# Patient Record
Sex: Female | Born: 1985 | Hispanic: Yes | Marital: Married
Health system: Midwestern US, Community
[De-identification: ages and names within clinical notes are randomized; demographics above are authoritative.]

## PROBLEM LIST (undated history)

## (undated) DIAGNOSIS — K529 Noninfective gastroenteritis and colitis, unspecified: Secondary | ICD-10-CM

## (undated) DIAGNOSIS — J069 Acute upper respiratory infection, unspecified: Secondary | ICD-10-CM

## (undated) DIAGNOSIS — N761 Subacute and chronic vaginitis: Secondary | ICD-10-CM

## (undated) DIAGNOSIS — B3731 Acute candidiasis of vulva and vagina: Secondary | ICD-10-CM

---

## 2016-01-21 ENCOUNTER — Ambulatory Visit
Admit: 2016-01-21 | Discharge: 2016-01-21 | Payer: PRIVATE HEALTH INSURANCE | Attending: Hospitalist | Primary: Family Medicine

## 2016-01-21 DIAGNOSIS — R0981 Nasal congestion: Secondary | ICD-10-CM

## 2016-01-21 MED ORDER — FLUTICASONE PROPIONATE 50 MCG/ACT NA SUSP
50 MCG/ACT | Freq: Every day | NASAL | 1 refills | Status: DC
Start: 2016-01-21 — End: 2016-06-01

## 2016-01-21 MED ORDER — ACYCLOVIR 5 % EX OINT
5 % | CUTANEOUS | 1 refills | Status: AC
Start: 2016-01-21 — End: 2016-01-28

## 2016-01-21 NOTE — Progress Notes (Signed)
Visit Information    Have you changed or started any medications since your last visit including any over-the-counter medicines, vitamins, or herbal medicines? yes - herbbal medications   Are you having any side effects from any of your medications? -  no  Have you stopped taking any of your medications? Is so, why? -  no    Have you seen any other physician or provider since your last visit? No  Have you had any other diagnostic tests since your last visit? No  Have you been seen in the emergency room and/or had an admission to a hospital since we last saw you? No  Have you had your routine dental cleaning in the past 6 months? no    Have you activated your MyChart account? If not, what are your barriers? Yes     No care team member to display    Medical History Review  Past Medical, Family, and Social History reviewed and does contribute to the patient presenting condition    Health Maintenance   Topic Date Due   ??? HIV screen  02/03/2003   ??? DTaP/Tdap/Td vaccine (1 - Tdap) 02/03/2007   ??? Cervical cancer screen  02/02/2009   ??? Flu vaccine (Season Ended) 05/05/2016

## 2016-01-21 NOTE — Progress Notes (Signed)
Attending Physician Statement  I have discussed the care of Allison Gibson, including pertinent history and exam findings with the resident. I have reviewed the key elements of all parts of the encounter with the resident. I have seen and examined the patient with the resident and the key elements of all parts of the encounter have been performed by me.  She has never seen PCP b/c of lack of insurance. She got coverage while pregnant. Now here to est care. I agree with the assessment, and status of the problem list as documented. The plan and orders should include   Orders Placed This Encounter   Procedures   ??? PAP Smear    and this was also documented by the resident.  Refuses vaccines- needs PAP at next visit .The medication list was reviewed with the resident and is up to date. The return visit should be in 1 month . Will obtain records from [place] and then rtc for follow up and PAP.    Allison Lameawan Breann Losano-Kasmani, MD, [place]ACP  [facility]  [facility]  Associate Program Director Department of Internal Medicine  Internal Medicine Clerkship Site Director- [facility]  01/21/2016, 11:56 AM

## 2016-01-21 NOTE — Patient Instructions (Addendum)
Thank you for enrolling in MyChart. Please follow the instructions below to securely access your online medical record. MyChart allows you to send messages to your doctor, view your test results, renew your prescriptions, schedule appointments, and more.     How Do I Sign Up?  1. In your Internet browser, go to [email or web address]  2. Click on the Sign Up Now link in the Sign In box. You will see the New Member Sign Up page.  3. Enter your MyChart Access Code exactly as it appears below. You will not need to use this code after you???ve completed the sign-up process. If you do not sign up before the expiration date, you must request a new code.  MyChart Access Code: [email or web address]  Expires: 03/21/2016 11:27 AM    4. Enter your Social Security Number ([id number]) and Date of Birth (mm/dd/yyyy) as indicated and click Submit. You will be taken to the next sign-up page.  5. Create a MyChart ID. This will be your MyChart login ID and cannot be changed, so think of one that is secure and easy to remember.  6. Create a MyChart password. You can change your password at any time.  7. Enter your Password Reset Question and Answer. This can be used at a later time if you forget your password.   8. Enter your e-mail address. You will receive e-mail notification when new information is available in MyChart.  9. Click Sign Up. You can now view your medical record.     Additional Information  If you have questions, please contact your physician practice where you receive care. Remember, MyChart is NOT to be used for urgent needs. For medical emergencies, dial 911.  Return appointment card and Summary of Care was reviewed and copy was given to the patient.  JM  [facility] and they will be contacting you with an appt, scheduling number is on referral form if you do not receive an appt in a timely manner JM

## 2016-01-21 NOTE — Progress Notes (Signed)
[facility]    New Patient Note/History and Physical    Date of patient's visit: 01/21/2016  Name:  Allison BrokerSarah Klang  Primary Care Physician: No primary care provider on file.    Reason for visit: First Visit, establish care     HISTORY OF PRESENTING ILLNESS:    History was obtained from the patient. Allison Gibson is a 30 y.o. is here to establish care.   She recently moved from [place] came to establish care. Denies any symptoms at this visit.   Past medical history of hemorrhoids. Does not want to take ant vaccinations.       PAST MEDICAL HISTORY:          Diagnosis Date   ??? Anxiety    ??? Chronic back pain        PAST SURGICAL HISTORY:          Procedure Laterality Date   ??? FRACTURE SURGERY     ??? LYMPH NODE DISSECTION         ALLERGIES:    No Known Allergies      HOME MEDICATION:      No current outpatient prescriptions on file prior to visit.     No current facility-administered medications on file prior to visit.        SOCIAL HISTORY:    TOBACCO:   reports that she has never smoked. She has never used smokeless tobacco.  ETOH:   reports that she drinks about 1.2 oz of alcohol per week   DRUGS:  reports that she does not use illicit drugs.  OCCUPATION:  None     FAMILY HISTORY:          Problem Relation Age of Onset   ??? Diabetes Mother    ??? Cancer Mother    ??? Hypotension Father    ??? High Cholesterol Father    ??? Migraines Father        REVIEW OF SYSTEMS:    ENT: negative  Respiratory: no cough, shortness of breath, or wheezing  Cardiovascular: no chest pain or dyspnea on exertion  Gastrointestinal: no abdominal pain, black or bloody stools  Neurological: no TIA or stroke symptoms  Endocrine: negative  Genito-Urinary: no dysuria, trouble voiding, or hematuria  Allergy and Immunology: negative  Hematological and Lymphatic: negative  Musculoskeletal: negative  Dermatological: negative    PHYSICAL EXAM:      Vitals:    01/21/16 1117   BP: 119/78   Pulse: 87      General appearance -  alert, well appearing, and in no distress  Mental status - alert, oriented to person, place, and time  Eyes - pupils equal and reactive  Ears - bilateral TM's and external ear canals normal  Nose - normal and patent  Mouth - mucous membranes moist, bifid tongue  Neck - supple, no significant adenopathy  Chest - clear to auscultation  Heart - normal S1, S2  Abdomen - soft, nontender, nondistended  Back exam - full range of motion, no tenderness  Neurological - alert, oriented, normal speech  Musculoskeletal - no joint tenderness  Extremities - peripheral pulses normal, no pedal edema  Skin - normal coloration and turgor, no rashes noted    LABORATORY FINDINGS:    CBC: No results found for: WBC, HGB, PLT  BMP:  No results found for: NA, K, CL, CO2, BUN, CREATININE, GLUCOSE  Hemoglobin A1C: No results found for: LABA1C  Lipid profile: No results found for: CHOL, TRIG,  HDL  Thyroid functions: No results found for: TSH   Hepatic functions: No results found for: ALT, AST, PROT, BILITOT, BILIDIR, LABALBU  Assessment and Plan:   1. Congestion of nasal sinus    - fluticasone (FLONASE) 50 MCG/ACT nasal spray; 1 spray by Nasal route daily  Dispense: 1 Bottle; Refill: 1    2. Oral herpes    - acyclovir (ZOVIRAX) 5 % ointment; Apply topically 5 times daily.  Dispense: 1 Tube; Refill: 1    3. Anxiety  Is taking herbal supplements Rhodelia and L tyrosine, turmeric  Discussed the potential harms of taking Over the counter drugs  States she has an upcoming appointments with psychiatrist    4. Screening    - PAP Smear; Future  - obtain records from outside hospital PAP smear and other work up    5. Hemorrhoids  - General surgery referral      Follow-up:       Bradley received counseling on the following healthy behaviors: nutrition, exercise    Discussed use, benefit, and side effects of prescribed medications.  Barriers to medication compliance addressed.  All patient questions answered.  Pt voiced understanding.        Blanchie Dessert, MD, PGY-1 Internal Medicine Resident  [facility], [place], [place]    01/21/2016  11:49 AM

## 2016-01-28 ENCOUNTER — Telehealth

## 2016-01-28 NOTE — Telephone Encounter (Signed)
Pt is sure she has a yeast infection. Pt complains of itching and it is getting worse. Pt would like some medication called into pharmacy. Please advise if pt needs to be seen. Pt would like a call back.

## 2016-01-28 NOTE — Telephone Encounter (Signed)
Patient needs to be seen please schedule the earliest appointment with any available PCP.  She needs some labs.

## 2016-01-28 NOTE — Telephone Encounter (Signed)
PC to pt, pt refuses appt for now. Pt states she gets them all the time. Pt will buy over the counter medication for now.

## 2016-02-05 ENCOUNTER — Encounter: Payer: PRIVATE HEALTH INSURANCE | Primary: Family Medicine

## 2016-02-12 MED ORDER — MICONAZOLE NITRATE 2 % VA CREA
2 % | VAGINAL | 1 refills | Status: AC
Start: 2016-02-12 — End: 2016-02-19

## 2016-02-12 NOTE — Telephone Encounter (Signed)
Miconazole ordered for vaginitis

## 2016-06-01 ENCOUNTER — Ambulatory Visit
Admit: 2016-06-01 | Discharge: 2016-06-01 | Payer: PRIVATE HEALTH INSURANCE | Attending: Family Medicine | Primary: Family Medicine

## 2016-06-01 DIAGNOSIS — F32A Depression, unspecified: Secondary | ICD-10-CM

## 2016-06-01 MED ORDER — FLUTICASONE PROPIONATE 50 MCG/ACT NA SUSP
50 | Freq: Every day | NASAL | 1 refills | Status: DC
Start: 2016-06-01 — End: 2017-11-01

## 2016-06-01 NOTE — Progress Notes (Signed)
Subjective:      Patient ID: Allison Gibson is a 30 y.o. female.  Visit Information    Have you changed or started any medications since your last visit including any over-the-counter medicines, vitamins, or herbal medicines? No  Are you having any side effects from any of your medications? -  no  Have you stopped taking any of your medications? Is so, why? -  no    Have you seen any other physician or provider since your last visit? No - new patient.  Have you had any other diagnostic tests since your last visit? No - new patient.   Have you been seen in the emergency room and/or had an admission to a hospital since we last saw you? No - new patient.   Have you had your routine dental cleaning in the past 6 months? no    Have you activated your MyChart account? If not, what are your barriers? No: Will sign up today.      Patient Care Team:  Archie Endo, MD as PCP - General (Family Medicine)    Medical History Review  Past Medical, Family, and Social History reviewed and does not contribute to the patient presenting condition    Health Maintenance   Topic Date Due   ??? HIV screen  02/03/2003   ??? DTaP/Tdap/Td vaccine (1 - Tdap) 02/03/2007   ??? Cervical cancer screen  02/02/2009   ??? Flu vaccine (1) 05/05/2016     HPI  This 30 year old female is seen first  by me in the office the patient has history of anxiety and depression was on Lexapro and she was out of town and she  stopped it and she states that that when she takes it she is tired a lot and the she used to see a psychiatrist so I told her we will refer to a psychiatrist to see what medicine they wanted to be on.  Has got fatigue and I told her I'll run some blood work  is on [employer] for rhinitis and she is not sure about allergies she has had she also complains of pain in her left knee he denies of any history of for trauma and she states that the pain seems to be getting worse has a mole on the right side of her back and I told her we will refer her to a  dermatologist  Review of Systems   Constitutional: Positive for fatigue. Negative for appetite change.   HENT: Positive for sneezing. Negative for ear pain and sore throat.    Eyes: Negative for visual disturbance.   Respiratory: Negative for shortness of breath.    Cardiovascular: Negative for chest pain.   Gastrointestinal: Negative for abdominal pain and nausea.   Genitourinary: Negative for frequency and pelvic pain.   Musculoskeletal: Negative for arthralgias.   Allergic/Immunologic: Positive for environmental allergies.   Neurological: Negative for dizziness, syncope and headaches.   Psychiatric/Behavioral: The patient is nervous/anxious.        Objective:   Physical Exam   Constitutional: She is oriented to person, place, and time. She appears well-developed and well-nourished.   BP 112/74   Pulse 86   Temp 97.9 ??F (36.6 ??C) (Oral)    Ht 5\' 2"  (1.575 m)   Wt 170 lb (77.1 kg)   SpO2 99%   BMI 31.09 kg/m2   HENT:   Head: Normocephalic.   Mouth/Throat: Oropharynx is clear and moist.        Cardiovascular: Normal  rate and regular rhythm.    Pulmonary/Chest: Breath sounds normal.   Abdominal: Soft. Bowel sounds are normal. There is no tenderness.   Musculoskeletal: She exhibits no tenderness.    No Swelling or tenderness present in the left knee   Lymphadenopathy:     She has no cervical adenopathy.   Neurological: She is alert and oriented to person, place, and time.   Nursing note and vitals reviewed.      Assessment:      1. Anxiety and depression  [place]- Ragothaman, [place], MD, Psychiatry [facility]   2. Fatigue, unspecified type  TSH without Reflex    T4, Free    Glucose, Fasting    Hemoglobin and Hematocrit, Blood   3. Left knee pain, unspecified chronicity  [place]- Lattie Corns, Elige Radon, MD, [facility] [place]*   4. Positive depression screening  Positive Screen for Clinical Depression with a Documented Follow-up Plan [phone]   5. Allergic rhinitis, unspecified allergic rhinitis trigger, unspecified rhinitis  seasonality  Continue Flonase            Plan:       I have personally reviewed and agree with the patient ROS, HPI and PFSH documented by rooming MA. I have added and/or deleted information as necessary.    Orders Placed This Encounter   Procedures   ??? TSH without Reflex     Standing Status:   Future     Standing Expiration Date:   06/01/2017   ??? T4, Free     Standing Status:   Future     Standing Expiration Date:   06/01/2017   ??? Glucose, Fasting     Standing Status:   Future     Standing Expiration Date:   06/01/2017   ??? Hemoglobin and Hematocrit, Blood     Standing Status:   Future     Standing Expiration Date:   06/01/2017   ??? [place]- Leane Call, Theador Hawthorne, MD, Psychiatry [facility]     Referral Priority:   Routine     Referral Type:   Consult for Advice and Opinion     Referral Reason:   Specialty Services Required     Referred to Provider:   Alyson Ingles, MD     Requested Specialty:   Psychiatry     Number of Visits Requested:   1   ??? - Lattie Corns, Elige Radon, MD, [facility] [place]*     Referral Priority:   Routine     Referral Type:   Consult for Advice and Opinion     Referral Reason:   Specialty Services Required     Referred to Provider:   Aretha Parrot, MD     Requested Specialty:   Orthopedic Surgery     Number of Visits Requested:   1   ??? Positive Screen for Clinical Depression with a Documented Follow-up Plan [phone]     Orders Placed This Encounter   Medications   ??? fluticasone (FLONASE) 50 MCG/ACT nasal spray     Sig: 1 spray by Nasal route daily     Dispense:  1 Bottle     Refill:  1     Return if symptoms worsen or fail to improve.    Continue current medications reviewed from the chart

## 2016-06-01 NOTE — Progress Notes (Signed)
On the basis of positive PHQ-9 screening (PHQ-9 Total Score: 26), the following plan was implemented: referral to psychiatry provided.  Patient will follow-up in 2 week(s) with psychiatrist.

## 2016-06-02 ENCOUNTER — Inpatient Hospital Stay: Payer: PRIVATE HEALTH INSURANCE | Primary: Family Medicine

## 2016-06-02 DIAGNOSIS — R5383 Other fatigue: Secondary | ICD-10-CM

## 2016-06-02 LAB — T4, FREE: Thyroxine, Free: 0.85 ng/dL — ABNORMAL LOW (ref 0.93–1.70)

## 2016-06-02 LAB — TSH: TSH: 1.75 mIU/L (ref 0.30–5.00)

## 2016-06-02 LAB — GLUCOSE, FASTING: Glucose, Fasting: 94 mg/dL (ref 70–99)

## 2016-06-02 LAB — HEMOGLOBIN AND HEMATOCRIT
Hematocrit: 39.5 % (ref 36–46)
Hemoglobin: 13.4 g/dL (ref 12.0–16.0)

## 2016-06-12 NOTE — Telephone Encounter (Signed)
Called and LM for patient letting her know that medication was denied and to get some Lotrimin OTC.

## 2016-06-12 NOTE — Telephone Encounter (Signed)
LM for patient letting her know that the steroid cream that she was requesting for her antifungal is refused because Dr Tasia Catchings said that it is not something that needs to be a continuous refill and that she can get Lotrimin OTC to use.

## 2016-07-01 ENCOUNTER — Ambulatory Visit
Admit: 2016-07-01 | Discharge: 2016-07-01 | Payer: PRIVATE HEALTH INSURANCE | Attending: Orthopaedic Surgery | Primary: Family Medicine

## 2016-07-01 DIAGNOSIS — S8992XA Unspecified injury of left lower leg, initial encounter: Secondary | ICD-10-CM

## 2016-07-01 MED ORDER — METHYLPREDNISOLONE 4 MG PO TBPK
4 MG | PACK | ORAL | 0 refills | Status: AC
Start: 2016-07-01 — End: ?

## 2016-07-01 NOTE — Progress Notes (Signed)
[facility]                 Allison ParrotBradley J Latrease Gibson M.D.            [address], [address]               [place], [place] [id number]             Dept Phone: [phone]             Dept Fax:  [phone]  Allison SagoSarah is a 30 year old patient seen for evaluation for left knee pain.  Patient states she's had pain for about a month or so.  She describes a mild anterior burning pain.  No previous injury or trauma or prior surgery.  She has no hip pain she has nothing on the right side.  She presently does not work.  She states that is getting somewhat better.    Examination the patient's left knee notes no obvious swelling.  She has a small little area of bruising but she says it is bumped it yesterday.  Her motion is 3 or 4?? of hyperextension flexion to about 135??.  No medial lateral or superior inferior apprehension she does have groin pain and quad activation exacerbates this.  She is slightly tight on the lateral side.  Minimal J sign.  Minimal Q angle increased.  She hasn't McMurray's which is negative.  Lachman's is negative.  Collaterals are negative.  No IT band findings.  No calf tenderness negative Homans.  She has no pain or rotation of her hips on either side.  No trochanteric findings.    Given the lack of any findings and the lack of any age in for any problems I did not get x-rays today    Impression  Mild patellofemoral chondrosis left knee    Plan  Patient be sent for therapy for this as well as a Medrol Dosepak.  Anticipate full recovery.  Back here when necessary          Review of Systems   Constitutional: Negative.    HENT: Negative.    Respiratory: Negative.    Cardiovascular: Negative.    Musculoskeletal: Negative.    Neurological: Negative.         Past Medical History:   Diagnosis Date   ??? Anxiety    ??? Chronic back pain    ??? Depression    ??? Gastritis      Past Surgical History:   Procedure Laterality Date   ??? FRACTURE SURGERY     ??? LYMPH NODE DISSECTION       Family History    Problem Relation Age of Onset   ??? Diabetes Mother    ??? Cancer Mother    ??? Hypotension Father    ??? High Cholesterol Father    ??? Migraines Father            Orders Placed This Encounter   Procedures   ??? [facility]     Referral Priority:   Routine     Referral Type:   Eval and Treat     Referral Reason:   Specialty Services Required     Requested Specialty:   Physical Therapy     Number of Visits Requested:   1       1. Left knee injury, initial encounter        Allison ParrotBradley J Wasim Hurlbut, MD    Please note  that this chart was generated using voice recognition [employer].  Although every effort was made to ensure the accuracy of this automated transcription, some errors in transcription may have occurred.

## 2016-07-08 NOTE — Progress Notes (Signed)
[facility]  [address]         Phone: [phone]       Fax: [phone]      Physical Therapy Lower Extremity Evaluation    Date:  07/08/2016  Patient: Allison Gibson  DOB: [birth year]  MRN: [id number]  Physician: Nita Sickle   Insurance: [employer]  Medical Diagnosis: (L) knee injury  Rehab Codes: S89.92XA  Onset date: Mar 16, 2016  Next Dr's appt.: PRN    Subjective:  Patient presents to physical therapy with complaints of (L) anterior knee pain at this time.  Patient noted with doing exercises, gym routine and was extending hip with hip extension machine when she noted a "pop" in the knee and anterior knee pain following this activity.  Patient was given oral steroid with minimal change in symptoms.  Noted fall approximately one month later onto the patella as well.  Patient currently notes complaints of pain with fully squatting and kneeling, notes inability to do normal exercise routine without pain.    CC: complaints of (L) anterior knee pain  HPI: Mar 16, 2016    PMHx: _0  Unremarkable _1  Diabetes _2  HTN  _3  Pacemaker   _4  MI/Heart Problems _5  Cancer _6  Arthritis _7  Other:              _8  Refer to full medical chart  In EPIC   Tests: _9  X-Ray: _10  MRI:  _11  Other:     Medications: _12  Refer to full medical record _13  None _14  Other:  Allergies:      _15  Refer to full medical record _16  None _17  Other:    Pain:  _18  Yes  _19  No Location: (L) anterior knee Pain Rating: (0-10 scale) 2-3/10  Pain altered Tx:  _20  Yes  _21  No  Action:  Symptoms:  _22  Improving _23  Worsening _24  Same    Objective:    ROM  ?? A/P STRENGTH TESTS (+/-) Left Right Not Tested    Left Right Left Right Ant. Drawer (-)  _25    Hip Flex   4  Post. Drawer   _26    ABD   4  Lachmans (-)  _27    ER   4+  Valgus Stress (-)  _28    KNEE flex 140 140 4+  Varus Stress (-)  _29    ext _30 McMurray???s (-)  _31         Pat-Fem Grind (+)  _32    Increased laxity (B) with ligamentous testing, but stops on  (L) sided testing.    OBSERVATION No Deficit Deficit Not Tested Comments   Posture       Genu Valgus _33  _34  _35     Genu Recurvatum _36  _37  _38     Pronation _39  _40  _41         FUNCTION Normal Difficult Unable   Squat _42  _43  _44    Kneel _45  _46  _47      FUNCTIONAL TESTS PAIN NO PAIN COMMENTS   Step Test 4??? _48  _49     6??? _50  _51     8??? _52  _53     Squat _54  _55         Comments:  Assessment:  Patient with limited tolerance of full exercise program at home, limited tolerance of squatting kneeling, limited tolerance of stairs intermittently.  Problems:    _56  ? Pain: Patient with pain in (L) anterior knee 2/10.      _57  ? Strength: Patient with limited knee and  hip strength testing with clinical testing   _0  ? Function: Patient with limited functional tolerance of squatting and kneeling, intermittent pain with stairs at this time.      STG: (to be met in 8 treatments)  1. ? Pain: Patient with improved pain levels to 0/10  2. ? Strength: KNEE and hip strength testing to 4+/5  3. ? Function: Normalized return to normal exercise program, normalized return to ascending and descending stairs in clinic with minimal to no pain, normalized return to squatting and kneeling with minimal to no pain.  4. Independent with Home Exercise Programs    LTG: see above                   Patient goals: return to exercises with minimal to no pain    Rehab Potential:  _1  Good  _2  Fair  _3  Poor   Suggested Professional Referral:  _4  No  _5  Yes:  Barriers to Goal Achievement::  _6  No  _7  Yes:  Domestic Concerns:  _8  No  _9  Yes:    Pt. Education:  _10  Plans/Goals, Risks/Benefits discussed  _11  Home exercise program    Method of Education: _12  Verbal  _13  Demo  _14  Written  Comprehension of Education:  _15  Verbalizes understanding.  _16  Demonstrates understanding.  _17  Needs Review.  _18  Demonstrates/verbalizes understanding of HEP/Ed previously given.    Treatment Plan:  _19  Therapeutic Exercise    _20  Modalities:  _21  Therapeutic Activity    _22  Ultrasound  _23   Electrical Stimulation  _24  Gait Training     _25  Massage       _26  Lumbar/Cervical Traction  _27  Neuromuscular Re-education _28  Cold/hotpack _29  Iontophoresis: 4 mg/mL  _30  Instruction in HEP             Dexamethasone Sodium  _31  Manual Therapy             Phosphate 40-80 mAmin  _32  Aquatic Therapy  _33  Vasocompression/    _34  Other:       Game Ready    _35   Medication allergies reviewed for use of    Dexamethasone Sodium Phosphate 62m/ml     with iontophoresis treatments.   Pt is not allergic.    Frequency:  2-3 x/week for 12 visits        Today???s Treatment:  Modalities:   Precautions:  Exercises:  Exercise Reps/ Time Weight/ Level Comments   NUSTEP      sidelying abduction      clamshells      SLR      3 way hip      Other:    Specific Instructions for next treatment:      Treatment Charges: Mins Units   _36  Evaluation       _37   Low       _38   Moderate       _39   High 30 1   _40   Modalities     _41   Ther Exercise 10 1   _42   Manual Therapy     _43   Ther Activities     _44   Aquatics     _45   Neuromuscular     _46   Other       TOTAL TREATMENT TIME: 40    Time in:0945   Time Out:1030    Electronically signed by: JCloyde Reams PT        Physician Signature:________________________________Date:______10/4/17____________  By signing above or cosigning this note, I have reviewed this plan  of care and certify a need for medically necessary rehabilitation services.     *PLEASE SIGN ABOVE AND FAX BACK ALL PAGES*

## 2016-07-13 ENCOUNTER — Encounter: Attending: Hospitalist | Primary: Family Medicine

## 2016-07-14 ENCOUNTER — Inpatient Hospital Stay: Admit: 2016-07-14 | Discharge: 2016-07-14 | Payer: PRIVATE HEALTH INSURANCE | Primary: Family Medicine

## 2016-07-14 NOTE — Progress Notes (Signed)
[  x] [facility] @ [facility]  [address] [place]  [place],  [address]  Phone [phone]  Fax [phone]    Physical Therapy Discharge Note    Date: 07/16/2016      Patient: Allison Gibson  DOB: [birth year]  MRN: [id number]                                                          Physician: Nita Sickle????????????????????????????????????????????????????????????????????????????????????????????????????????????????????????????Insurance: [employer]  Medical Diagnosis:??(L) knee injury????????????????????????????????????????????????????????????????????????????????Rehab Codes:??H47.42VZ  Onset date: 03/27/16????????????????????????????????????????????????????????????????????????Next Dr's appt.: PRN  Visit# / total visits: 2/12??????????????????????????????????????????????????????????????????????Cancels/No Shows: 0/0          '[]'$  Patient recovered from conditions. Treatment goals were met.  '[]'$  Patient received maximum benefit. No further therapy indicated at this time.  '[]'$  Patient demonstrated improvement from condition with  ** Of  ** Short term goals met.  '[]'$ Patient demonstrated improvement from condition with **   Of **  Long term goals met.  '[]'$  Patient to continue exercise/home instructions independently.  '[x]'$  Therapy interrupted due to: Issues with transportation   '[]'$  Patient has 2 or more no shows/cancels, is discontinued per our policy.    '[]'$  Patient has completed prescribed number of treatment sessions.    '[]'$  Other:        It Is My Understanding That The:  '[]'$  Patient returned to work.  '[]'$  Patient demonstrated improved level of function.  '[]'$  Patient returned to previous functional level.  '[]'$  Patient's current functional status is unknown due to no-shows  '[x]'$  Other: Patient to DC to HEP at this time, issues with attending therapy secondary to issues with transportation.  Patient showed extensive therpay program, HEP including emphasis on quad, hip abductor strengthening at this time and should improve with adherence to HEP at this time.    Recommendations/Comments:                   Treatment Included:  '[x]'$  Therapeutic  Exercise    '[]'$  Modalities:  '[]'$  Therapeutic Activity    '[]'$  Ultrasound  '[]'$  Electrical Stimulation  '[]'$  Gait Training     '[]'$  Massage       '[]'$  Lumbar/Cervical Traction  '[x]'$  Neuromuscular Re-education '[]'$  Cold/hotpack '[]'$  Iontophoresis: 4 mg/mL  '[x]'$  Instruction in Home Exercise Program                     Dexamethasone Sodium  '[]'$  Manual Therapy             Phosphate 40-80 mAmin  '[]'$  Aquatic Therapy                                            '[]'$  Other:                   If you have any questions or concerns regarding this patient's care, please contact us.   Thank you for your referral.      Electronically signed by: Cloyde Reams, PT

## 2016-07-14 NOTE — Progress Notes (Signed)
[facility]  [address]         Phone: [phone]       Fax: [phone]    Physical Therapy Daily Treatment Note     Date:  07/14/2016  Patient Name:  Allison BrokerSarah Gatliff    DOB:  11/30/1985  MRN: [id number]  Physician: Olene CravenMorse, Bradley     Physician: Olene CravenMorse, Bradley????????????????????????????????????????????????????????????????????????????????????????????????????????????????????????????Insurance: [employer] brokeraramount Advantage  Medical Diagnosis:??(L) knee injury????????????????????????????????????????????????????????????????????????????????Rehab Codes:??H08.65HQS89.92XA  Onset date: June 2017????????????????????????????????????????????????????????????????????????Next Dr's appt.: PRN  Visit# / total visits: 2/12                                   Cancels/No Shows: 0/0      Subjective:    Pain:  [x]  Yes  []  No                  Location: (L) anterior knee            Pain Rating: (0-10 scale) 2-3/10  Pain altered Tx:  []  Yes  [x]  No  Action:  Symptoms:  []  Improving                 []  Worsening              [x]  Same    Objective:  Modalities:   Precautions:  Exercises:  Exercise Reps/ Time Weight/ Level Comments   NUSTEP ??10 ?? ??   sidelying abduction ??20x ?? ??   clamshells ??20x ?? ??   SLR ??20x ?? ??   3 way hip foam (B) 20x ?? ??   Sideways stairs 20x     Heel touch 4" 20x                     Other:    Specific Instructions for next treatment:    Treatment Charges: Mins Units   []   Modalities     [x]   Ther Exercise 30 2   []   Manual Therapy     []   Ther Activities     []   Aquatics     [x]   Neuromuscular 10 1   []   Other     Total Treatment time 40 3       Assessment:  Added and progressed strengthening exercises per flow sheet with emphasis on hip abductor and quad strengthening to allow improved mechanics/alignment during gait/activity.    [x]  Progressing toward goals.    []  No change.     []  Other:        Pt. Education:  [x]  Yes  []  No  [x]  Reviewed Prior HEP/Ed  Method of Education: []  Verbal  []  Demo  []  Written  Comprehension of Education:  [x]  Verbalizes understanding.  []  Demonstrates understanding.  []  Needs  review.  []  Demonstrates/verbalizes HEP/Ed previously given.     Plan: [x]  Continue per plan of care.   []  Other:      Time In:1015            Time Out: 1055    Electronically signed by:  Delene LollJason Sota Hetz, PT

## 2016-07-16 NOTE — Progress Notes (Signed)
[facility]  [address]         Phone: [phone]       Fax: [phone]    Physical Therapy    CANCEL/NO SHOW NOTE  Date:  07/16/2016  Patient Name:  Allison Gibson    DOB:  05/03/1986  MRN: [id number]   Physician: Olene CravenMorse, Bradley                                                              Insurance: [employer]age  Medical Diagnosis: (L) knee injury                                        Rehab Codes: S89.92XA  Onset date: June 2017                                    Next Dr's appt.: PRN    Visit# / total visits: 1/12  Cancels/No Shows: 1/0    CANCELLED PT; [facility]    Electronically signed by:  June LeapShelby M Reynolds, PTA

## 2016-07-17 ENCOUNTER — Inpatient Hospital Stay: Payer: PRIVATE HEALTH INSURANCE | Primary: Family Medicine

## 2016-07-21 ENCOUNTER — Encounter: Payer: PRIVATE HEALTH INSURANCE | Primary: Family Medicine

## 2016-07-21 ENCOUNTER — Encounter: Attending: Hospitalist | Primary: Family Medicine

## 2016-07-23 ENCOUNTER — Encounter: Payer: PRIVATE HEALTH INSURANCE | Primary: Family Medicine

## 2016-10-29 ENCOUNTER — Inpatient Hospital Stay: Payer: PRIVATE HEALTH INSURANCE | Primary: Family Medicine

## 2016-10-29 DIAGNOSIS — F331 Major depressive disorder, recurrent, moderate: Secondary | ICD-10-CM

## 2016-10-29 LAB — CBC WITH AUTO DIFFERENTIAL
Absolute Eos #: 0.4 10*3/uL (ref 0.0–0.4)
Absolute Lymph #: 2 10*3/uL (ref 1.0–4.8)
Absolute Mono #: 0.6 10*3/uL (ref 0.1–1.3)
Basophils Absolute: 0.1 10*3/uL (ref 0.0–0.2)
Basophils: 1 % (ref 0–2)
Eosinophils %: 6 % — ABNORMAL HIGH (ref 0–4)
Hematocrit: 39.9 % (ref 36–46)
Hemoglobin: 13.4 g/dL (ref 12.0–16.0)
Lymphocytes: 31 % (ref 24–44)
MCH: 30.6 pg (ref 26–34)
MCHC: 33.5 g/dL (ref 31–37)
MCV: 91.4 fL (ref 80–100)
MPV: 8.8 fL (ref 6.0–12.0)
Monocytes: 9 % — ABNORMAL HIGH (ref 1–7)
Platelets: 286 10*3/uL (ref 150–450)
RBC: 4.37 m/uL (ref 4.0–5.2)
RDW: 13.7 % (ref 11.5–14.9)
Seg Neutrophils: 53 % (ref 36–66)
Segs Absolute: 3.6 10*3/uL (ref 1.3–9.1)
WBC: 6.7 10*3/uL (ref 3.5–11.0)

## 2016-10-29 LAB — COMPREHENSIVE METABOLIC PANEL
ALT: 10 U/L (ref 5–33)
AST: 23 U/L (ref <32)
Albumin: 4.6 g/dL (ref 3.5–5.2)
Alkaline Phosphatase: 66 U/L (ref 35–104)
Anion Gap: 12 mmol/L (ref 9–17)
BUN: 7 mg/dL (ref 6–20)
CO2: 28 mmol/L (ref 20–31)
Calcium: 9.3 mg/dL (ref 8.6–10.4)
Chloride: 98 mmol/L (ref 98–107)
Creatinine: 0.56 mg/dL (ref 0.50–0.90)
GFR African American: >60 mL/min (ref >60)
GFR Non-African American: >60 mL/min (ref >60)
Glucose: 83 mg/dL (ref 70–99)
Potassium: 4.3 mmol/L (ref 3.7–5.3)
Sodium: 138 mmol/L (ref 135–144)
Total Bilirubin: 0.33 mg/dL (ref 0.3–1.2)
Total Protein: 7.7 g/dL (ref 6.4–8.3)

## 2016-10-29 LAB — VITAMIN D 25 HYDROXY: Vit D, 25-Hydroxy: 21.6 ng/mL — ABNORMAL LOW (ref 30.0–100.0)

## 2016-10-29 LAB — LIPID PANEL
Chol/HDL Ratio: 2.9 (ref <5)
Cholesterol: 215 mg/dL — ABNORMAL HIGH (ref <200)
HDL: 74 mg/dL (ref >40)
LDL Cholesterol: 113 mg/dL (ref 0–130)
Triglycerides: 142 mg/dL (ref <150)

## 2016-10-29 LAB — HEMOGLOBIN A1C
Estimated Avg Glucose: 94 mg/dL
Hemoglobin A1C: 4.9 % (ref 4.0–6.0)

## 2016-12-13 IMAGING — US US NON OB TRANSVAGINAL (TECH USE ONLY)
1 series · 14 of 27 positions shown · non-contrast
Comparison: Nothing recent.

HISTORY: IUD surveillance. The patient's last menstrual period December 20, 2014.
TECHNIQUE: Pelvic ultrasound performed transvaginal and transabdominal techniques.

[Series 1: us non ob transvaginal (tech use only) · 0.26mm/px · 14 of 27 slices shown]
[im 1/27]
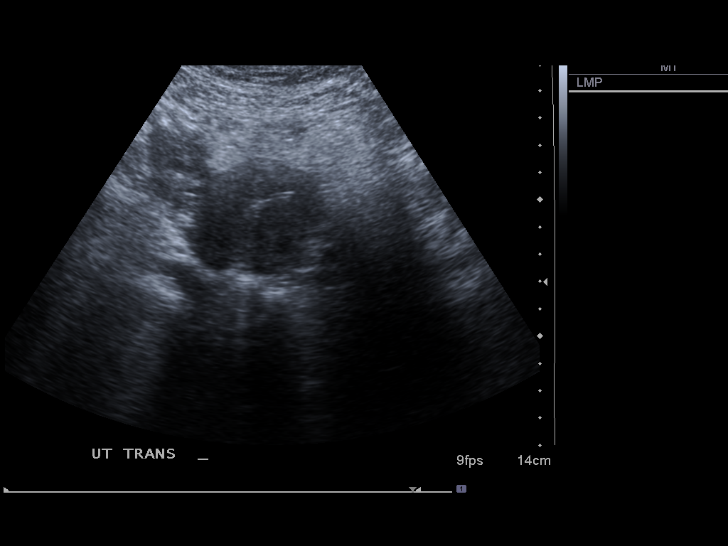
[im 3/27]
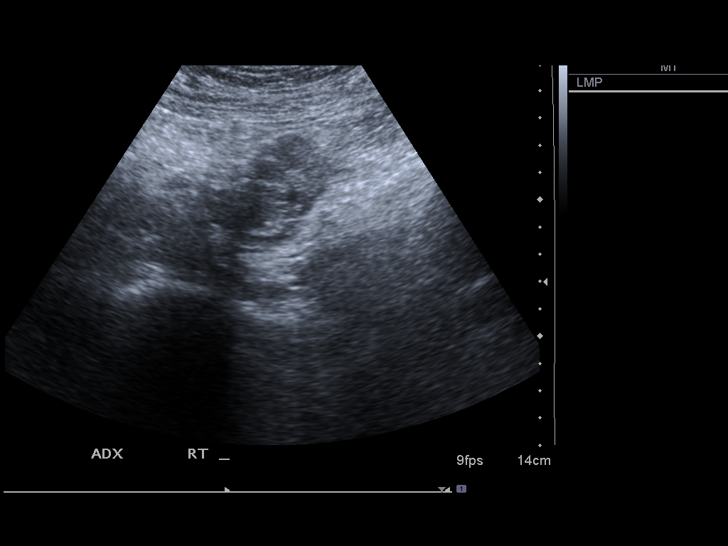
[im 5/27]
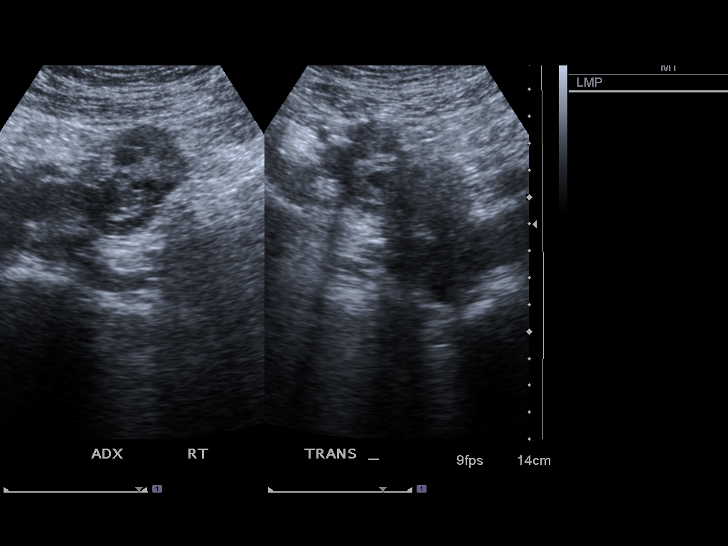
[im 7/27]
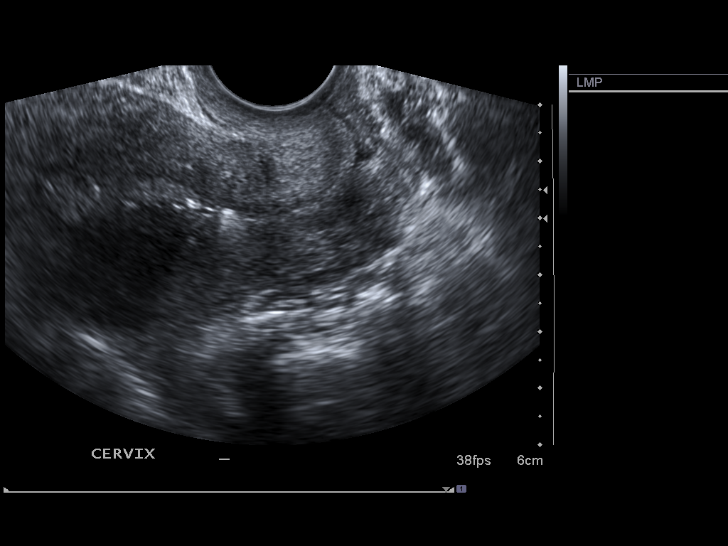
[im 9/27]
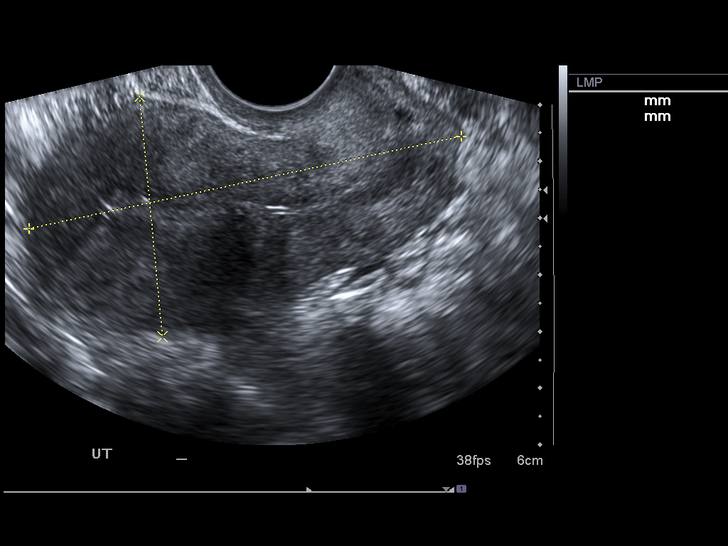
[im 11/27]
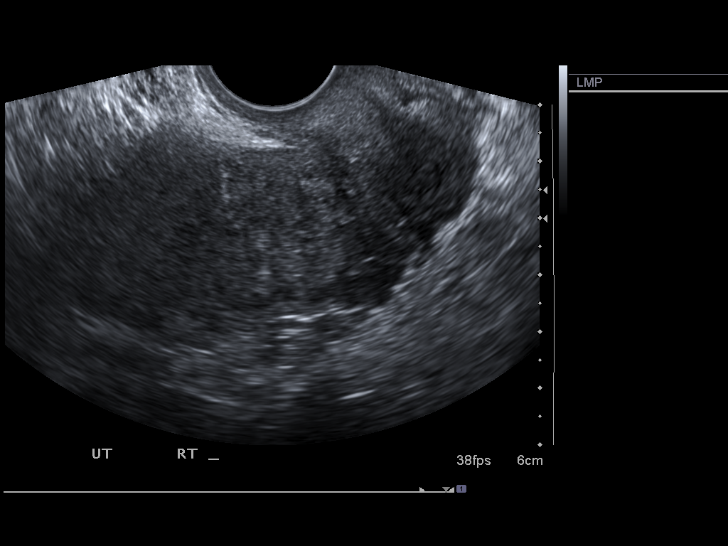
[im 13/27]
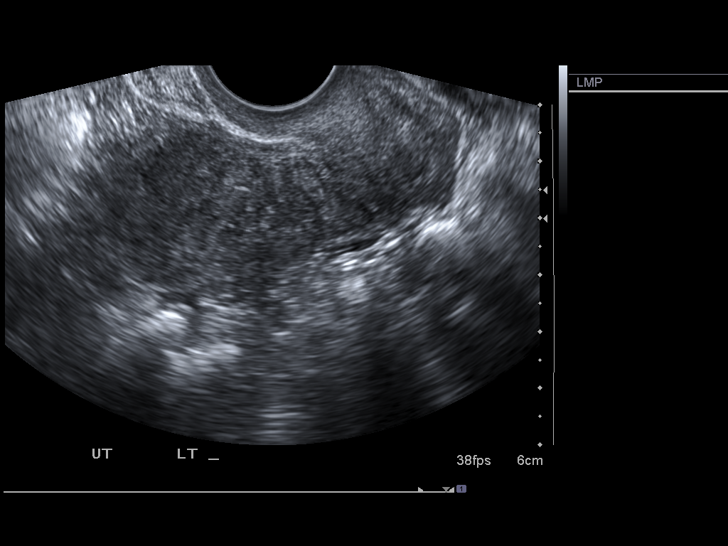
[im 15/27]
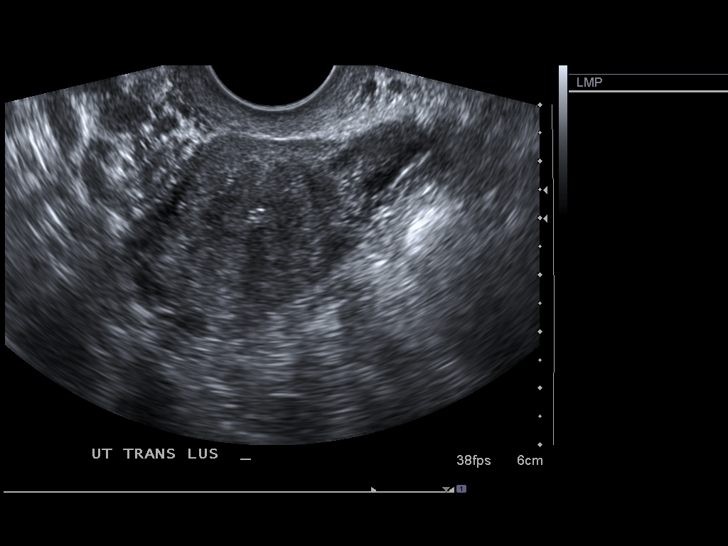
[im 17/27]
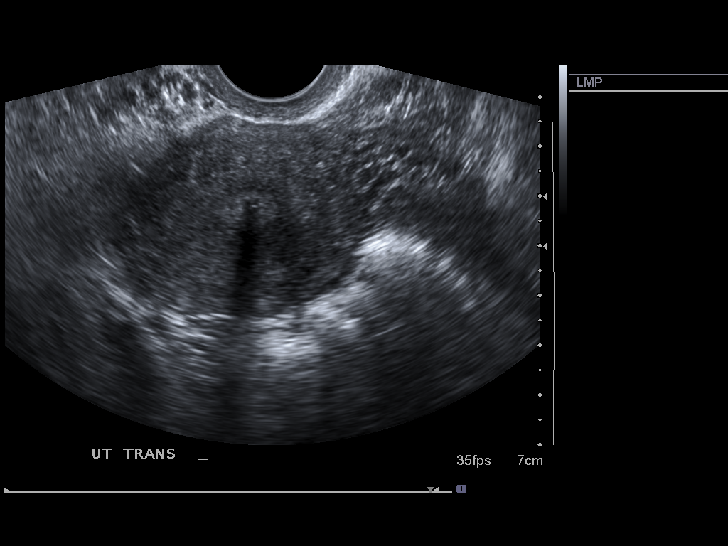
[im 19/27]
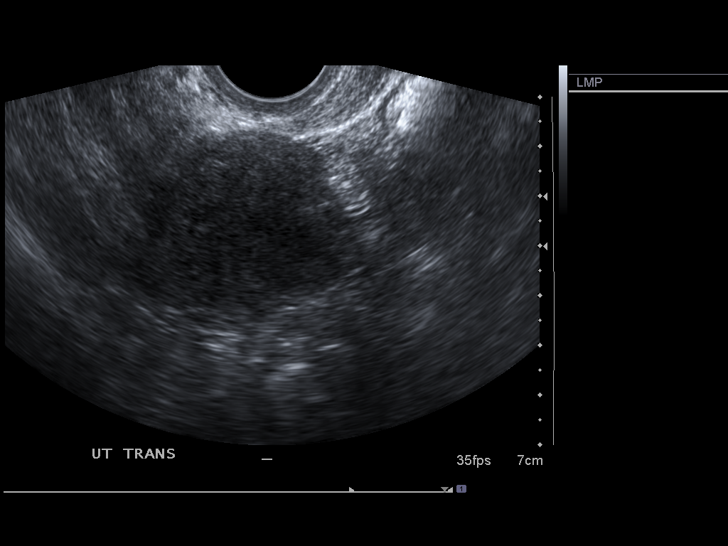
[im 21/27]
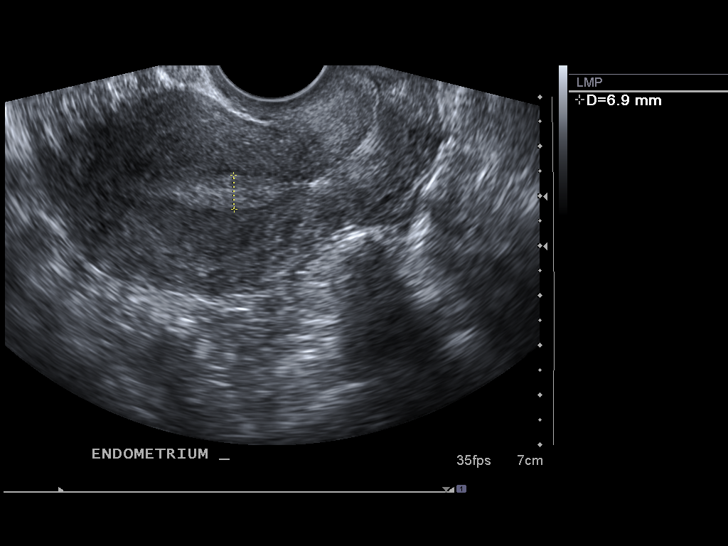
[im 23/27]
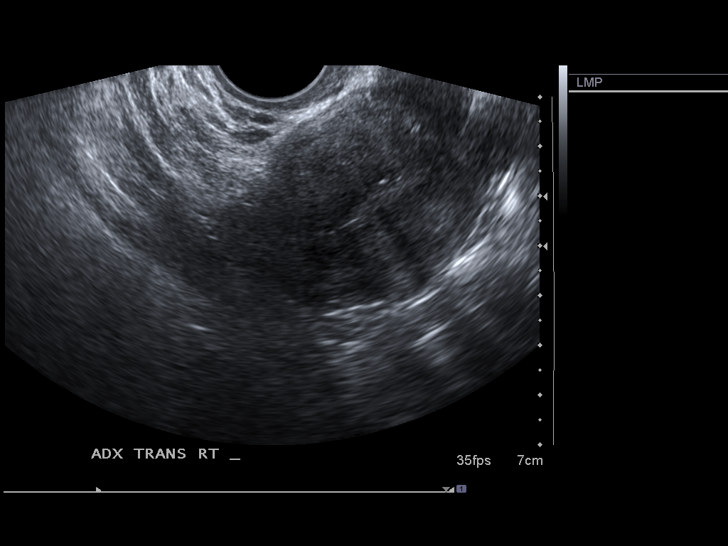
[im 25/27]
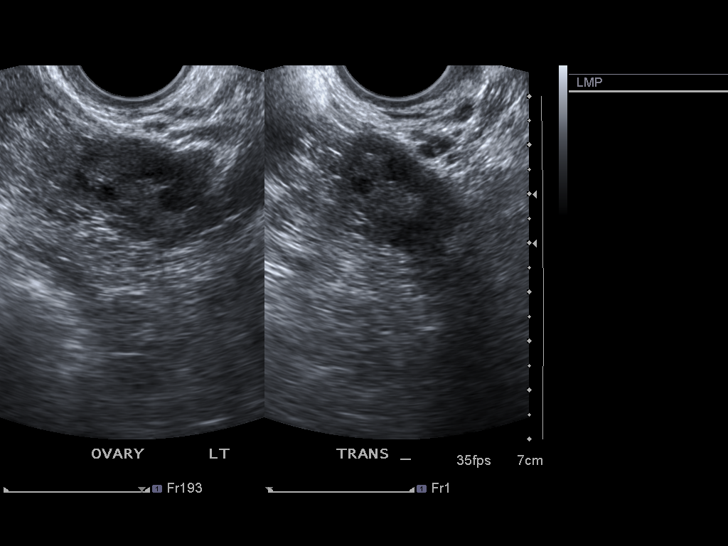
[im 27/27]
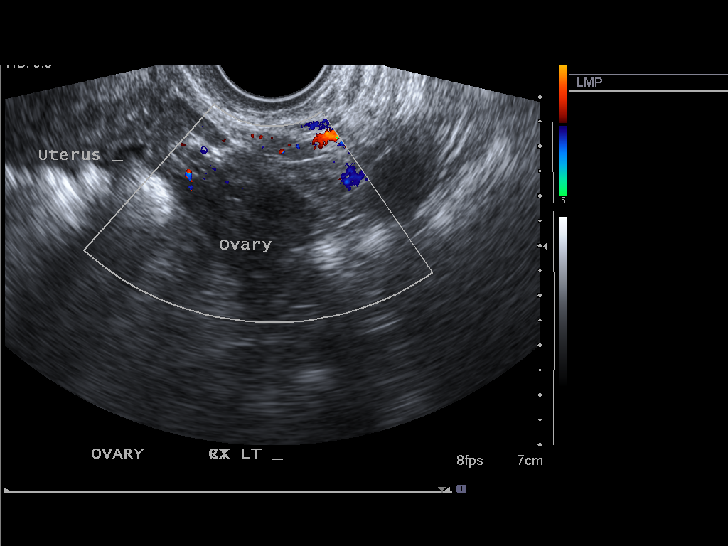

[14 of 27 positions shown; findings below may reference images not displayed]

FINDINGS: The uterus is anteverted measuring 78 x 43 x 49 mm.

Endometrium is 7 mm thick. There is an intrauterine device present within the endometrial canal.

Right ovary is only seen transabdominally measuring 44 x 32 x 27 mm in size mildly prominent. No visualized cystic mass.

Left ovary is 29 x 21 x 16 mm.

No free fluid seen.
IMPRESSION: 1. Intrauterine device within endometrial cavity appropriately.

2. Mildly enlarged right ovary.

3. No free fluid.

## 2016-12-25 NOTE — Telephone Encounter (Signed)
From: Allison BrokerSarah Gibson  To: Allison EndoSuraiya J Ahmed, MD  Sent: 12/25/2016 7:24 AM EDT  Subject: Prescription Question    Im currenlty having a flare up with my hemmeroids and i wanted to know if I could recieve some medication for it. I'm working a Biomedical scientistlot latey and don't have a lot of time for a appointment.

## 2017-02-11 IMAGING — US US NON OB TRANSVAGINAL W LTD TA
1 series · 14 of 28 positions shown · non-contrast
Comparison: 12/21/2014.

HISTORY: Follow up cyst.
TECHNIQUE: Transabdominal and transvaginal pelvic ultrasound was performed.

[Series 1: us non ob transvaginal w ltd ta · 0.24mm/px · 14 of 36 slices shown]
[im 2/36]
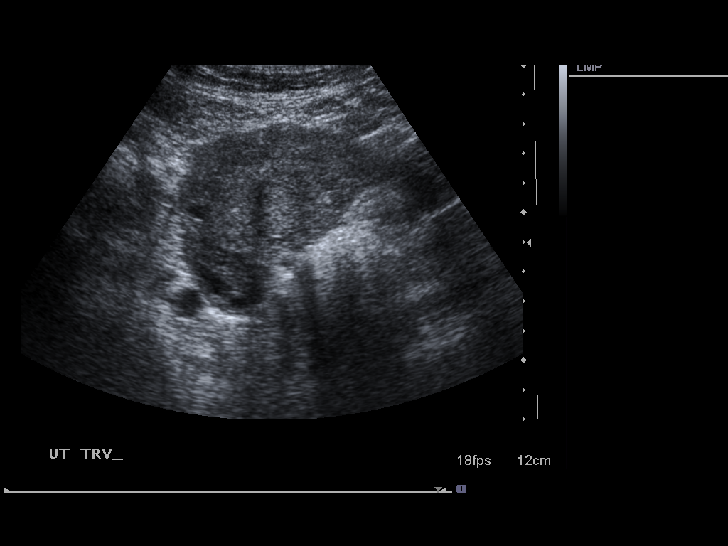
[im 4/36]
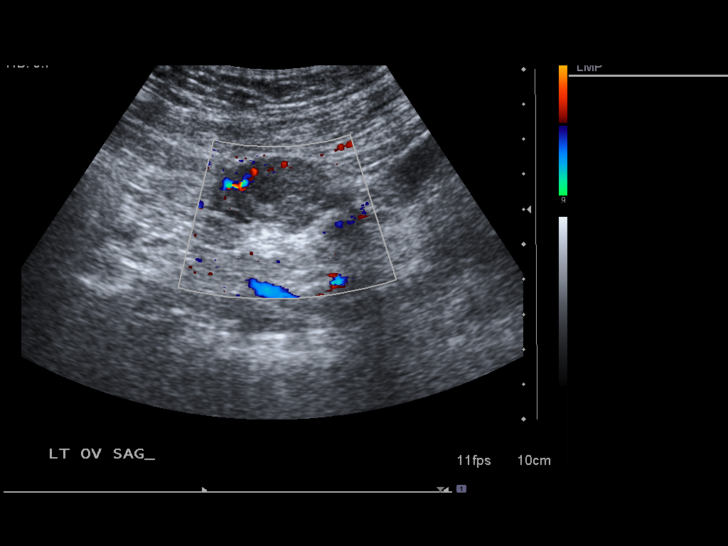
[im 7/36]
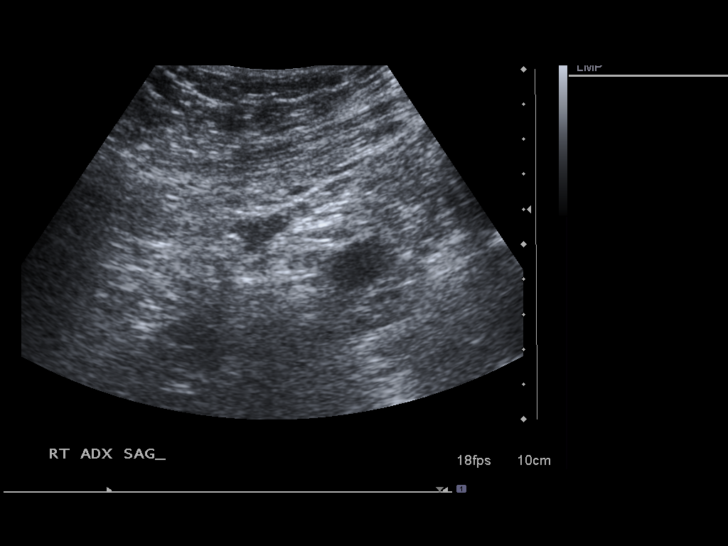
[im 10/36]
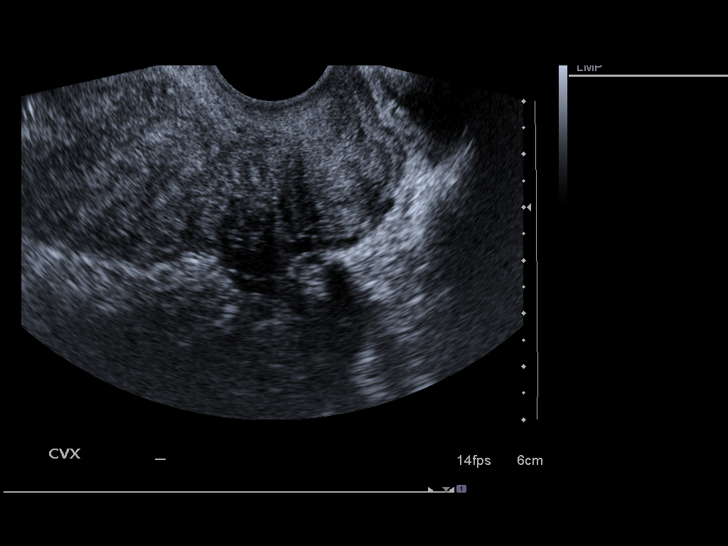
[im 12/36]
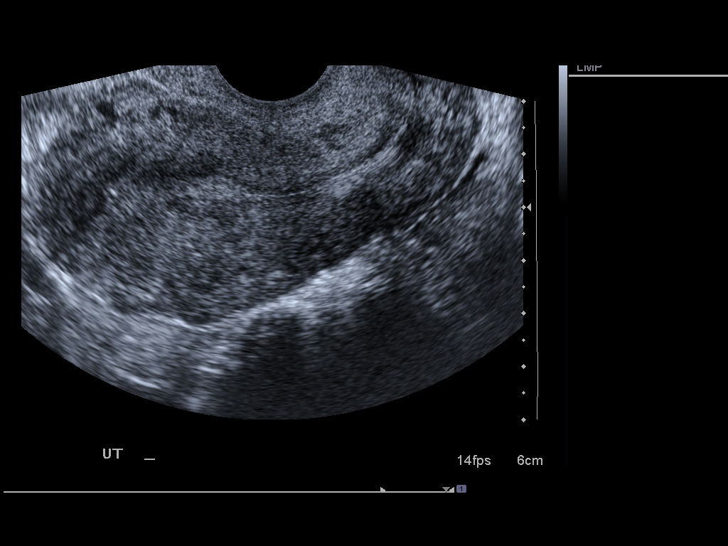
[im 15/36]
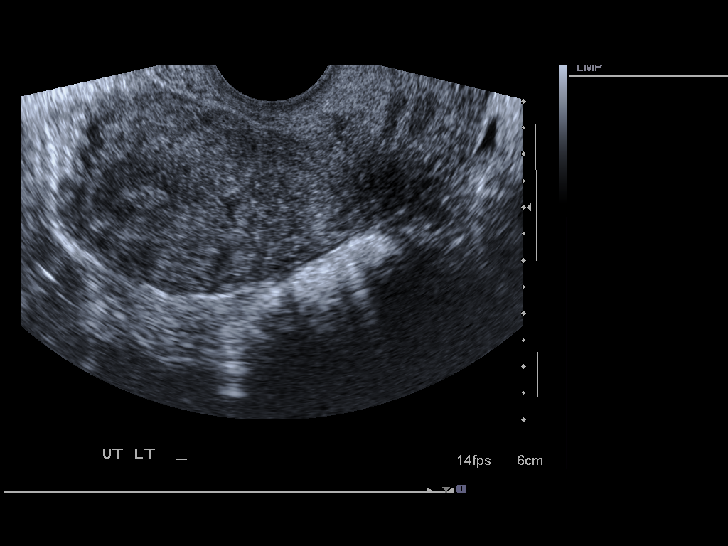
[im 17/36]
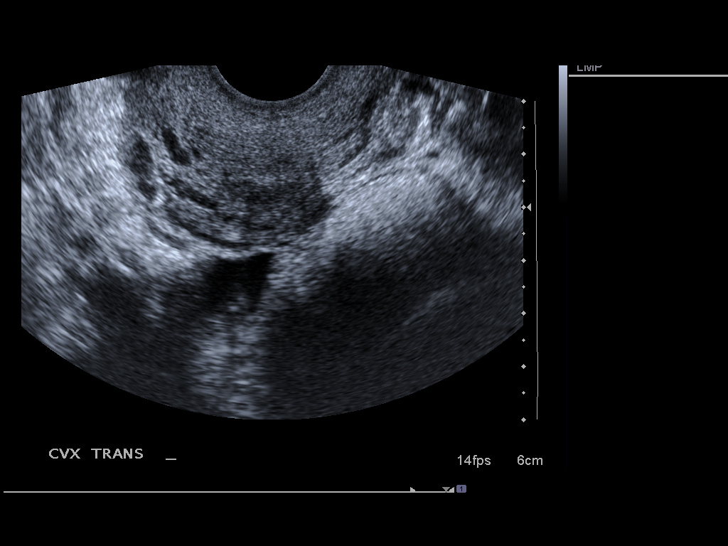
[im 20/36]
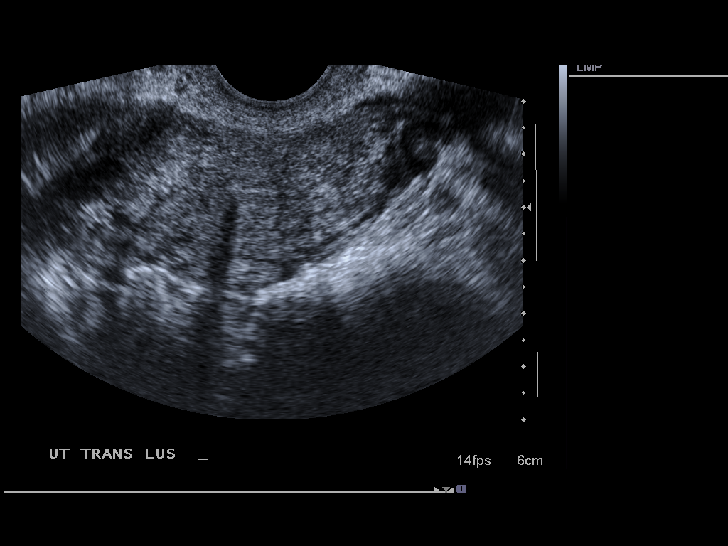
[im 23/36]
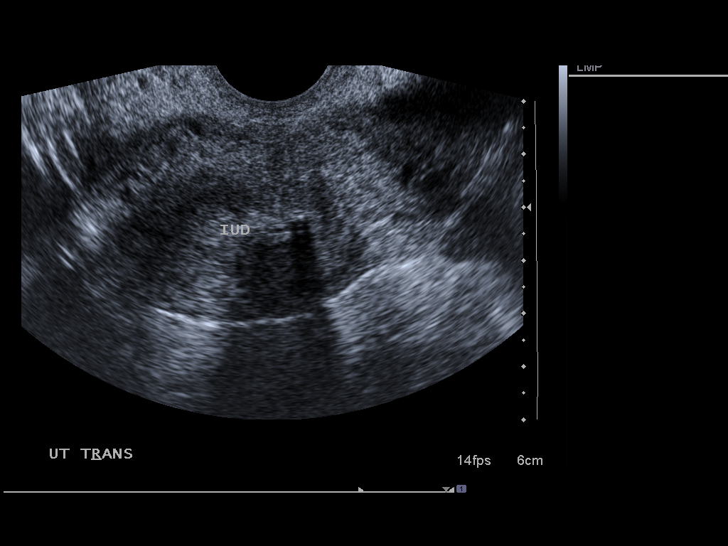
[im 25/36]
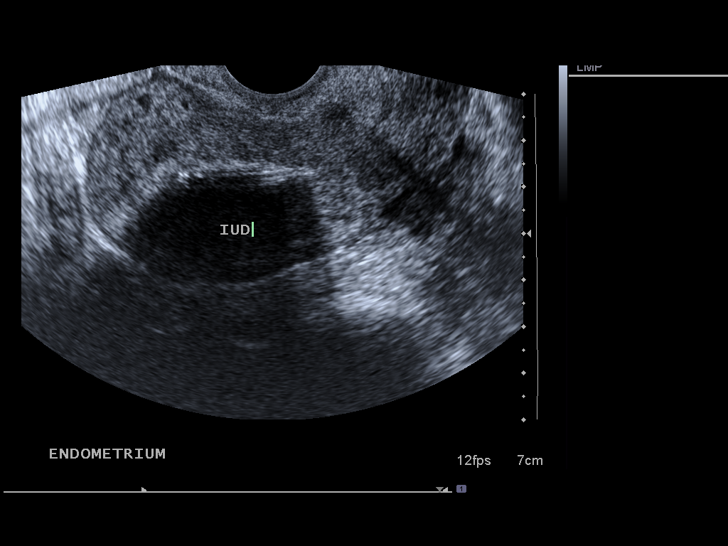
[im 28/36]
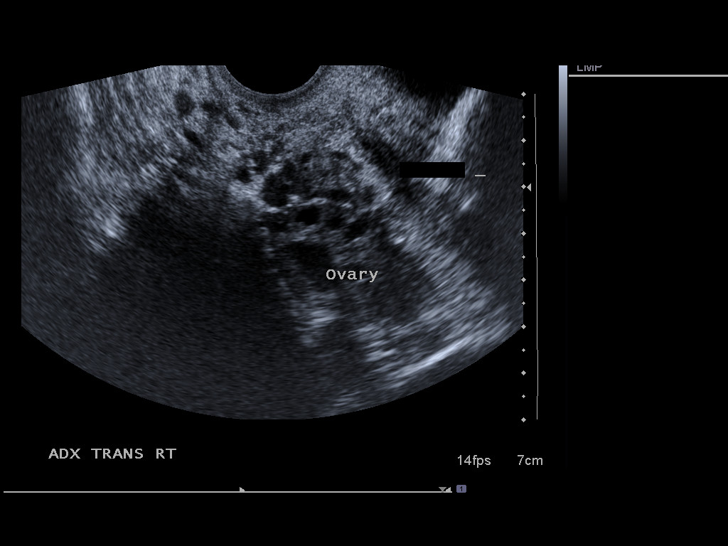
[im 30/36]
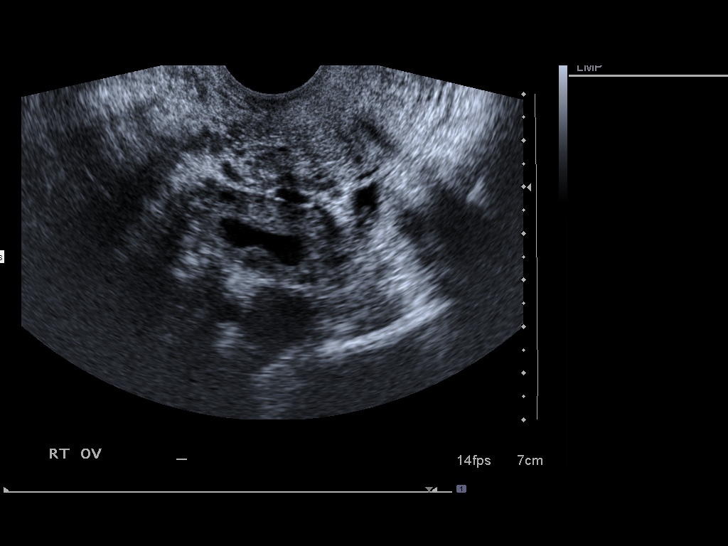
[im 33/36]
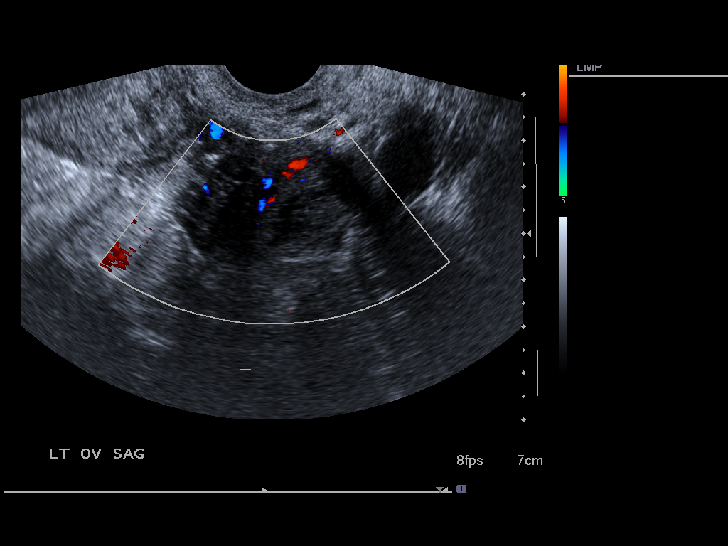
[im 36/36]
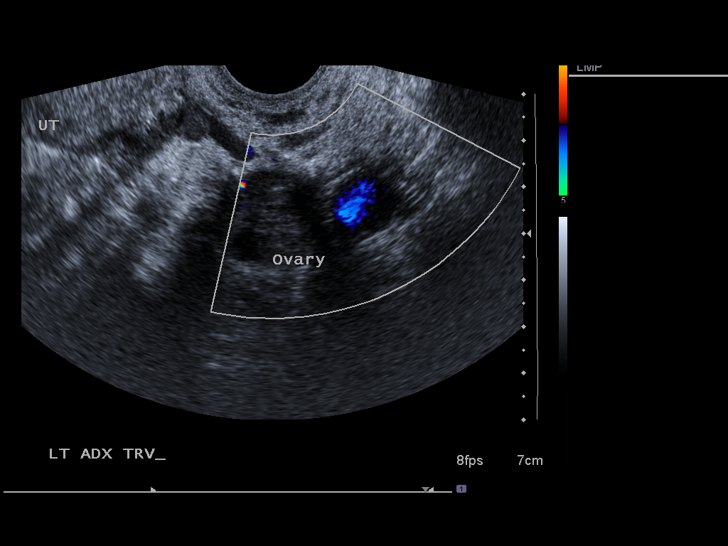

[14 of 28 positions shown; findings below may reference images not displayed]

FINDINGS: The uterus is anteverted, nongravid, normal in size and echogenicity and measures 8.6 x 4.2 x 6.4 cm. IUD appears in satisfactory position. The endometrial echo complex is within normal thickness for age measuring 5 mm. The right ovary measured 3.7 x 2.1 x 2.4 cm and contained a dominant follicle measuring 2.2 cm. The left ovary was unremarkable and measured 3.9 x 2.2 x 3.3 cm.
IMPRESSION: 1. IUD appears in satisfactory position.

2. No abnormal ovarian cysts or masses.

## 2017-06-29 MED ORDER — FLUCONAZOLE 150 MG PO TABS
150 | ORAL_TABLET | Freq: Once | ORAL | 0 refills | Status: AC
Start: 2017-06-29 — End: 2017-06-29

## 2017-06-29 MED ORDER — FLUCONAZOLE 150 MG PO TABS
150 MG | ORAL_TABLET | Freq: Once | ORAL | 0 refills | Status: DC
Start: 2017-06-29 — End: 2017-06-29

## 2017-06-29 NOTE — Progress Notes (Signed)
Subjective  Vaginal discharge    HPI    Pt having vaginal discharge for two weeks.   Has tried multiple otc yeast infection medications that are not working.   Symptoms include itching and irritation.  Has had previous yeast infections before.     There are no active problems to display for this patient.    Past Medical History:   Diagnosis Date   . Anxiety    . Chronic back pain    . Depression    . Gastritis      Past Surgical History:   Procedure Laterality Date   . FRACTURE SURGERY     . LYMPH NODE DISSECTION       Family History   Problem Relation Age of Onset   . Diabetes Mother    . Cancer Mother    . Hypotension Father    . High Cholesterol Father    . Migraines Father      Social History     Social History   . Marital status: Married     Spouse name: N/A   . Number of children: N/A   . Years of education: N/A     Social History Main Topics   . Smoking status: Never Smoker   . Smokeless tobacco: Never Used   . Alcohol use No   . Drug use: No   . Sexual activity: Yes     Partners: Male     Birth control/ protection: Condom     Other Topics Concern   . Not on file     Social History Narrative   . No narrative on file     Current Outpatient Prescriptions on File Prior to Visit   Medication Sig Dispense Refill   . FLUoxetine (PROZAC) 20 MG capsule Take 20 mg by mouth daily     . methylPREDNISolone (MEDROL DOSEPACK) 4 MG tablet Take by mouth as directed on package 1 kit 0   . vitamin D (CHOLECALCIFEROL) 1000 UNIT TABS tablet      . clotrimazole-betamethasone (LOTRISONE) 1-0.05 % cream      . Multiple Vitamin (DAILY-VITE) TABS      . fluticasone (FLONASE) 50 MCG/ACT nasal spray 1 spray by Nasal route daily 1 Bottle 1     No current facility-administered medications on file prior to visit.      Allergies   Allergen Reactions   . Molds & Smuts        Review of Systems  Vaginal discharge and irritation      Objective    There were no vitals filed for this visit.  Physical Exam     N/A    Assessment & Plan    Vaginal  candidiasis      No orders of the defined types were placed in this encounter.      Orders Placed This Encounter   Medications   . fluconazole (DIFLUCAN) 150 MG tablet     Sig: Take 1 tablet by mouth once for 1 dose     Dispense:  1 tablet     Refill:  0       Fu prn.    Molinda Bailiff, APRN - CNP\

## 2017-06-29 NOTE — Telephone Encounter (Signed)
From: Caswell Corwin, APRN - CNP  To: Allison Gibson  Sent: 06/29/2017 9:30 AM EDT  Subject: E-Visit    I have sent in a round of diflucan for you. I would recommend following up with PCP or obgyn if these yeast infections are on a monthly basis to rule out any underlying cause for reinfection.     Thanks!  Burnett Sheng, APRN CNP

## 2017-11-01 MED ORDER — FLUTICASONE PROPIONATE 50 MCG/ACT NA SUSP
50 | Freq: Every day | NASAL | 1 refills | Status: AC
Start: 2017-11-01 — End: ?
